# Patient Record
Sex: Male | Born: 1995 | Race: Black or African American | Hispanic: No | Marital: Single | Smoking: Never smoker
Health system: Southern US, Community
[De-identification: ages and names within clinical notes are randomized; demographics above are authoritative.]

---

## 2017-06-07 ENCOUNTER — Ambulatory Visit (INDEPENDENT_AMBULATORY_CARE_PROVIDER_SITE_OTHER): Payer: BLUE CROSS/BLUE SHIELD

## 2017-06-07 ENCOUNTER — Ambulatory Visit (INDEPENDENT_AMBULATORY_CARE_PROVIDER_SITE_OTHER): Payer: BLUE CROSS/BLUE SHIELD | Admitting: Podiatry

## 2017-06-07 DIAGNOSIS — M2141 Flat foot [pes planus] (acquired), right foot: Secondary | ICD-10-CM

## 2017-06-07 DIAGNOSIS — M2142 Flat foot [pes planus] (acquired), left foot: Secondary | ICD-10-CM

## 2017-06-07 DIAGNOSIS — M659 Synovitis and tenosynovitis, unspecified: Secondary | ICD-10-CM

## 2017-06-07 DIAGNOSIS — Q742 Other congenital malformations of lower limb(s), including pelvic girdle: Secondary | ICD-10-CM | POA: Diagnosis not present

## 2017-06-07 DIAGNOSIS — M79672 Pain in left foot: Principal | ICD-10-CM

## 2017-06-07 DIAGNOSIS — M79671 Pain in right foot: Secondary | ICD-10-CM

## 2017-06-07 DIAGNOSIS — B353 Tinea pedis: Secondary | ICD-10-CM

## 2017-06-07 MED ORDER — KETOCONAZOLE 2 % EX CREA: TOPICAL_CREAM | CUTANEOUS | 0 refills | Status: DC
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
## 2017-06-07 NOTE — Addendum Note (Signed)
Addended by: Ventura SellersPRICE, MICHAEL on: 06/07/2017 05:40 PM   Modules accepted: Level of Service

## 2017-06-11 ENCOUNTER — Other Ambulatory Visit: Payer: BLUE CROSS/BLUE SHIELD | Admitting: Orthotics

## 2017-07-18 ENCOUNTER — Other Ambulatory Visit: Payer: Self-pay | Admitting: Podiatry

## 2017-07-18 DIAGNOSIS — M2142 Flat foot [pes planus] (acquired), left foot: Principal | ICD-10-CM

## 2017-07-18 DIAGNOSIS — M2141 Flat foot [pes planus] (acquired), right foot: Secondary | ICD-10-CM

## 2017-07-26 ENCOUNTER — Ambulatory Visit: Payer: BLUE CROSS/BLUE SHIELD | Admitting: Podiatry

## 2017-10-09 ENCOUNTER — Encounter (HOSPITAL_COMMUNITY): Payer: Self-pay

## 2017-10-09 ENCOUNTER — Other Ambulatory Visit: Payer: Self-pay

## 2017-10-09 ENCOUNTER — Emergency Department (HOSPITAL_COMMUNITY)
Admission: EM | Admit: 2017-10-09 | Discharge: 2017-10-09 | Disposition: A | Discharge status: Home / Self Care | Payer: No Typology Code available for payment source | Attending: Emergency Medicine | Admitting: Emergency Medicine

## 2017-10-09 ENCOUNTER — Emergency Department (HOSPITAL_COMMUNITY): Payer: No Typology Code available for payment source

## 2017-10-09 DIAGNOSIS — M545 Low back pain, unspecified: Secondary | ICD-10-CM

## 2017-10-09 MED ORDER — METHOCARBAMOL 500 MG PO TABS: 500.0000 mg | ORAL_TABLET | Freq: Two times a day (BID) | ORAL | 0 refills | Status: AC

## 2017-10-09 NOTE — ED Notes (Signed)
Declined W/C at D/C and was escorted to lobby by RN. 
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
## 2018-08-17 ENCOUNTER — Other Ambulatory Visit: Payer: Self-pay

## 2018-08-17 DIAGNOSIS — Z20822 Contact with and (suspected) exposure to covid-19: Secondary | ICD-10-CM

## 2018-08-18 LAB — NOVEL CORONAVIRUS, NAA: SARS-CoV-2, NAA: NOT DETECTED

## 2019-05-06 ENCOUNTER — Other Ambulatory Visit: Payer: Self-pay | Admitting: *Deleted

## 2019-05-06 DIAGNOSIS — U071 COVID-19: Secondary | ICD-10-CM

## 2019-05-14 ENCOUNTER — Other Ambulatory Visit (HOSPITAL_COMMUNITY): Payer: Self-pay

## 2019-05-14 ENCOUNTER — Other Ambulatory Visit: Payer: Self-pay

## 2019-08-19 ENCOUNTER — Ambulatory Visit: Payer: No Typology Code available for payment source | Attending: Internal Medicine

## 2019-08-19 DIAGNOSIS — Z23 Encounter for immunization: Secondary | ICD-10-CM

## 2019-08-19 NOTE — Progress Notes (Signed)
° °  Covid-19 Vaccination Clinic  Name:  Stephen Walls    MRN: [id number] DOB: 1995-01-09  08/19/2019  [name] was observed post Covid-19 immunization for 15 minutes without incident. He was provided with Vaccine Information Sheet and instruction to access the V-Safe system.   [name] was instructed to call 911 with any severe reactions post vaccine:  Difficulty breathing   Swelling of face and throat   A fast heartbeat   A bad rash all over body   Dizziness and weakness   Immunizations Administered    Name Date Dose VIS Date Route   Moderna COVID-19 Vaccine 08/19/2019  3:53 PM 0.5 mL 12/2018 Intramuscular   Manufacturer: Moderna   Lot: [id number]   NDC: 83818-403-75

## 2019-09-16 ENCOUNTER — Ambulatory Visit: Payer: No Typology Code available for payment source | Attending: Internal Medicine

## 2019-09-16 DIAGNOSIS — Z23 Encounter for immunization: Secondary | ICD-10-CM

## 2019-09-16 NOTE — Progress Notes (Signed)
   Covid-19 Vaccination Clinic  Name:  Stephen Walls    MRN: [id number] DOB: 1995/03/28  09/16/2019  Stephen Walls was observed post Covid-19 immunization for 15 minutes without incident. He was provided with Vaccine Information Sheet and instruction to access the V-Safe system.   Stephen Walls was instructed to call 911 with any severe reactions post vaccine: Marland Kitchen Difficulty breathing  . Swelling of face and throat  . A fast heartbeat  . A bad rash all over body  . Dizziness and weakness   Immunizations Administered    Name Date Dose VIS Date Route   Moderna COVID-19 Vaccine 09/16/2019  2:18 PM 0.5 mL 12/2018 Intramuscular   Manufacturer: Moderna   Lot: [id number]   NDC: 99833-825-05
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
